# Patient Record
Sex: Female | Born: 1980 | Hispanic: Yes | Marital: Single | Smoking: Never smoker
Health system: Southern US, Community
[De-identification: ages and names within clinical notes are randomized; demographics above are authoritative.]

---

## 2015-11-20 ENCOUNTER — Emergency Department (HOSPITAL_COMMUNITY)
Admission: EM | Admit: 2015-11-20 | Discharge: 2015-11-20 | Disposition: A | Discharge status: Home / Self Care | Payer: Self-pay | Attending: Emergency Medicine | Admitting: Emergency Medicine

## 2015-11-20 ENCOUNTER — Emergency Department (HOSPITAL_COMMUNITY): Payer: Self-pay

## 2015-11-20 ENCOUNTER — Encounter (HOSPITAL_COMMUNITY): Payer: Self-pay | Admitting: Emergency Medicine

## 2015-11-20 DIAGNOSIS — R112 Nausea with vomiting, unspecified: Secondary | ICD-10-CM

## 2015-11-20 DIAGNOSIS — K529 Noninfective gastroenteritis and colitis, unspecified: Secondary | ICD-10-CM | POA: Insufficient documentation

## 2015-11-20 DIAGNOSIS — R1011 Right upper quadrant pain: Secondary | ICD-10-CM

## 2015-11-20 DIAGNOSIS — Z3202 Encounter for pregnancy test, result negative: Secondary | ICD-10-CM | POA: Insufficient documentation

## 2015-11-20 LAB — URINE MICROSCOPIC-ADD ON
BACTERIA UA: NONE SEEN
RBC / HPF: NONE SEEN RBC/hpf (ref 0–5)

## 2015-11-20 LAB — COMPREHENSIVE METABOLIC PANEL
ALT: 20 U/L (ref 14–54)
AST: 21 U/L (ref 15–41)
Albumin: 4.3 g/dL (ref 3.5–5.0)
Alkaline Phosphatase: 38 U/L (ref 38–126)
Anion gap: 9 (ref 5–15)
BUN: 10 mg/dL (ref 6–20)
CO2: 22 mmol/L (ref 22–32)
Calcium: 9 mg/dL (ref 8.9–10.3)
Chloride: 105 mmol/L (ref 101–111)
Creatinine, Ser: 0.66 mg/dL (ref 0.44–1.00)
GFR calc Af Amer: >60 mL/min (ref >60)
GFR calc non Af Amer: >60 mL/min (ref >60)
Glucose, Bld: 97 mg/dL (ref 65–99)
Potassium: 3.6 mmol/L (ref 3.5–5.1)
Sodium: 136 mmol/L (ref 135–145)
Total Bilirubin: 0.7 mg/dL (ref 0.3–1.2)
Total Protein: 7.5 g/dL (ref 6.5–8.1)

## 2015-11-20 LAB — CBC WITH DIFFERENTIAL/PLATELET
Basophils Absolute: 0 10*3/uL (ref 0.0–0.1)
Basophils Relative: 0 %
Eosinophils Absolute: 0.7 10*3/uL (ref 0.0–0.7)
Eosinophils Relative: 6 %
HCT: 42.1 % (ref 36.0–46.0)
Hemoglobin: 13.9 g/dL (ref 12.0–15.0)
Lymphocytes Relative: 23 %
Lymphs Abs: 2.7 10*3/uL (ref 0.7–4.0)
MCH: 29.2 pg (ref 26.0–34.0)
MCHC: 33 g/dL (ref 30.0–36.0)
MCV: 88.4 fL (ref 78.0–100.0)
Monocytes Absolute: 0.4 10*3/uL (ref 0.1–1.0)
Monocytes Relative: 4 %
Neutro Abs: 7.9 10*3/uL — ABNORMAL HIGH (ref 1.7–7.7)
Neutrophils Relative %: 67 %
Platelets: 203 10*3/uL (ref 150–400)
RBC: 4.76 MIL/uL (ref 3.87–5.11)
RDW: 13.1 % (ref 11.5–15.5)
WBC: 11.8 10*3/uL — ABNORMAL HIGH (ref 4.0–10.5)

## 2015-11-20 LAB — URINALYSIS, ROUTINE W REFLEX MICROSCOPIC
Bilirubin Urine: NEGATIVE
Glucose, UA: NEGATIVE mg/dL
Hgb urine dipstick: NEGATIVE
Ketones, ur: NEGATIVE mg/dL
Leukocytes, UA: NEGATIVE
Nitrite: NEGATIVE
Protein, ur: NEGATIVE mg/dL
Specific Gravity, Urine: 1.03 (ref 1.005–1.030)
pH: 6 (ref 5.0–8.0)

## 2015-11-20 LAB — POC URINE PREG, ED: Preg Test, Ur: NEGATIVE

## 2015-11-20 LAB — LIPASE, BLOOD: LIPASE: 35 U/L (ref 11–51)

## 2015-11-20 MED ORDER — MORPHINE SULFATE (PF) 4 MG/ML IV SOLN
4.0000 mg | Freq: Once | INTRAVENOUS | Status: AC
  Administered 2015-11-20: 4 mg via INTRAVENOUS
  Filled 2015-11-20: qty 1

## 2015-11-20 MED ORDER — IOHEXOL 300 MG/ML  SOLN
100.0000 mL | Freq: Once | INTRAMUSCULAR | Status: AC | PRN
  Administered 2015-11-20: 100 mL via INTRAVENOUS

## 2015-11-20 MED ORDER — IOHEXOL 300 MG/ML  SOLN
25.0000 mL | INTRAMUSCULAR | Status: DC
  Administered 2015-11-20: 50 mL via ORAL

## 2015-11-20 MED ORDER — IOHEXOL 300 MG/ML  SOLN: 50.0000 mL | INTRAMUSCULAR | Status: DC

## 2015-11-20 MED ORDER — SODIUM CHLORIDE 0.9 % IV BOLUS (SEPSIS)
1000.0000 mL | Freq: Once | INTRAVENOUS | Status: AC
  Administered 2015-11-20: 1000 mL via INTRAVENOUS

## 2015-11-20 MED ORDER — HYDROCODONE-ACETAMINOPHEN 5-325 MG PO TABS: 1.0000 | ORAL_TABLET | Freq: Four times a day (QID) | ORAL | Status: AC | PRN

## 2015-11-20 MED ORDER — ONDANSETRON HCL 4 MG PO TABS: 4.0000 mg | ORAL_TABLET | Freq: Three times a day (TID) | ORAL | Status: DC | PRN

## 2015-11-20 MED ORDER — ONDANSETRON HCL 4 MG/2ML IJ SOLN
4.0000 mg | Freq: Once | INTRAMUSCULAR | Status: AC
  Administered 2015-11-20: 4 mg via INTRAVENOUS
  Filled 2015-11-20: qty 2

## 2015-11-20 MED ORDER — CIPROFLOXACIN HCL 500 MG PO TABS: 500.0000 mg | ORAL_TABLET | Freq: Two times a day (BID) | ORAL | Status: AC

## 2015-11-20 MED ORDER — METRONIDAZOLE 500 MG PO TABS: 500.0000 mg | ORAL_TABLET | Freq: Three times a day (TID) | ORAL | Status: AC

## 2015-11-20 NOTE — Discharge Instructions (Signed)
Your abdominal pain is from an infection in your intestines. You will need to take the antibiotics Flagyl and Ciprofloxacin as directed and until finished. Avoid alcohol use while taking the antibiotics. Use zofran as needed for nausea. Use norco as needed for pain but don't drive or operate machinery while taking this medication. Go to [facility] to fill your prescriptions in order to get the lowest cost and use the coupons given to you in this packet. Follow up with [facility] in one week for recheck of your abdominal pain. Return to the ER for changes or worsening symptoms.  Abdominal (belly) pain can be caused by many things. Your caregiver performed an examination and possibly ordered blood/urine tests and imaging (CT scan, x-rays, ultrasound). Many cases can be observed and treated at home after initial evaluation in the emergency department. Even though you are being discharged home, abdominal pain can be unpredictable. Therefore, you need a repeated exam if your pain does not resolve, returns, or worsens. Most patients with abdominal pain don't have to be admitted to the hospital or have surgery, but serious problems like appendicitis and gallbladder attacks can start out as nonspecific pain. Many abdominal conditions cannot be diagnosed in one visit, so follow-up evaluations are very important. SEEK IMMEDIATE MEDICAL ATTENTION IF YOU DEVELOP ANY OF THE FOLLOWING SYMPTOMS:  The pain does not go away or becomes severe.   A temperature above 101 develops.   Repeated vomiting occurs (multiple episodes).   The pain becomes localized to portions of the abdomen. The right side could possibly be appendicitis. In an adult, the left lower portion of the abdomen could be colitis or diverticulitis.   Blood is being passed in stools or vomit (bright red or black tarry stools).   Return also if you develop chest pain, difficulty breathing, dizziness or fainting, or become confused, poorly  responsive, or inconsolable (young children).  The constipation stays for more than 4 days.   There is belly (abdominal) or rectal pain.   You do not seem to be getting better.      Dolor abdominal en adultos (Abdominal Pain, Adult) El dolor de estmago (abdominal) puede tener muchas causas. La mayora de las veces, el dolor de [place] no es peligroso. Muchos de [employer] de dolor de estmago pueden controlarse y tratarse en casa. CUIDADOS EN EL HOGAR   No tome medicamentos que lo ayuden a defecar (laxantes), salvo que su mdico se lo indique.  Solo tome los medicamentos que le haya indicado su mdico.  Coma o beba lo que le indique su mdico. Su mdico le dir si debe seguir una dieta especial. SOLICITE AYUDA SI:  No sabe cul es la causa del dolor de [place].  Tiene dolor de estmago cuando siente ganas de vomitar (nuseas) o tiene colitis (diarrea).  Tiene dolor durante la miccin o la evacuacin.  El dolor de estmago lo despierta de noche.  Tiene dolor de [employer] empeora o [place] cuando come.  Tiene dolor de [employer] empeora cuando come [employer].  Tiene fiebre. SOLICITE AYUDA DE INMEDIATO SI:   El dolor no desaparece en un plazo mximo de 2horas.  No deja de (vomitar).  El dolor cambia y se Librarian, academic solo en la parte derecha o izquierda del [place].  La materia fecal es sanguinolenta o de aspecto alquitranado. ASEGRESE DE QUE:   Comprende estas instrucciones.  Controlar su afeccin.  Recibir ayuda de inmediato si no mejora o si empeora.   Esta  informacin no tiene Theme park managercomo fin reemplazar el consejo del mdico. Asegrese de hacerle al mdico cualquier pregunta que tenga.   Document Released: 03/06/2009 Document Revised: 12/29/2014 Elsevier Interactive Patient Education 2016 [employer]  Nuseas y vmitos (Nausea and Vomiting)  Naseas significa que tiene ganas de vomitar. Elvmitoes un reflejo por el que los contenidos del  estmago salen por la boca.  CUIDADOS EN EL HOGAR  Tome los medicamentos como le indic el mdico.  No se esfuerce por comer. Sin embargo, es necesario que tome lquidos.  Si tiene ganas de comer, [place] una dieta normal, segn las indicaciones del mdico.  Coma arroz, trigo, patatas, pan, carnes magras, yogur, frutas y vegetales.  Evite los alimentos UAL Corporationmuy grasos.  Beba gran cantidad de lquidos para mantener la orina de tono claro o color amarillo plido.  Consulte a su mdico como reponer la prdida de lquidos (rehidratacin). Los signos de prdida de lquidos (deshidratacin) son:  Chief of Staffentir mucha sed.  Tiene los labios y la boca secos.  Est mareado.  La orina es Lillyoscura.  Orina menos que lo normal.  Se siente confundido.  La respiracin o la frecuencia cardaca son rpidas. SOLICITE AYUDA DE INMEDIATO SI:  Observa sangre en su vmito.  La materia fecal (heces)es negra o de color rojo.  Siente un dolor de cabeza muy intenso o el cuello rgido.  Se siente confundido.  Siente dolor muy fuerte en el vientre (abdominal).  Tiene dolor en el pecho o dificultad para respirar.  No ha orinado durante 8 horas.  Tiene la piel fra y pegajosa.  Sigue vomitando despus de 24  48 horas.  Tiene fiebre. ASEGRESE QUE:  Comprende estas instrucciones.  Controlar la enfermedad.  Puede solicitar ayuda de inmediato si los sntomas no mejoran, o si empeoran.   Esta informacin no tiene Theme park managercomo fin reemplazar el consejo del mdico. Asegrese de hacerle al mdico cualquier pregunta que tenga.   Document Released: 05/28/2010 Document Revised: 03/01/2012 Elsevier Interactive Patient Education [employer]2016 Elsevier Inc.

## 2015-11-20 NOTE — ED Provider Notes (Signed)
CSN: [id number]     Arrival date & time 11/20/15  1022 History   First MD Initiated Contact with Patient 11/20/15 1115     Chief Complaint  Patient presents with  . Abdominal Pain     (Consider location/radiation/quality/duration/timing/severity/associated sxs/prior Treatment) HPI Comments: Jean Vincent is a 34 y.o. female with no PMHx or PSHx, who presents to the ED with complaints of gradual onset right upper quadrant abdominal pain 4 days. She describes the pain is 10/10 intermittent crampy type pain in the right upper quadrant radiating to her back, worse with eating, and unrelieved with ibuprofen. Associated symptoms include nausea and 3 episodes of nonbloody nonbilious emesis which started today. She recently traveled from [place] on 11/16.  She denies any fevers, chills, chest pain, shortness of breath, diarrhea, constipation, melena, hematochezia, obstipation, dysuria, hematuria, vaginal bleeding or discharge, numbness, tingling, or weakness. She denies any sick contacts, alcohol use, chronic NSAID use, recent antibiotics, prior abdominal surgeries, or suspicious food intake. LMP was 11/16. She denies sexually active at this time or in the recent past. She has no medical problems and takes no medications regularly. 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  Patient is a 34 y.o. female presenting with abdominal pain. The history is provided by the patient. A language interpreter was used (family and provider).  Abdominal Pain Pain location:  RUQ Pain quality: cramping   Pain radiates to:  Back Pain severity:  Severe Onset quality:  Gradual Duration:  4 days Timing:  Intermittent Progression:  Unchanged Chronicity:  New Context: recent travel   Context: not sick contacts and not suspicious food intake   Relieved by:  Nothing Worsened by:  Eating Ineffective treatments:  NSAIDs Associated symptoms: nausea and vomiting   Associated symptoms: no chest pain, no chills, no constipation, no  diarrhea, no dysuria, no fever, no flatus, no hematemesis, no hematochezia, no hematuria, no melena, no shortness of breath, no vaginal bleeding and no vaginal discharge   Risk factors: no alcohol abuse, has not had multiple surgeries and no NSAID use     History reviewed. No pertinent past medical history. History reviewed. No pertinent past surgical history. No family history on file. Social History  Substance Use Topics  . Smoking status: Never Smoker   . Smokeless tobacco: None  . Alcohol Use: No   OB History    No data available     Review of Systems  Constitutional: Negative for fever and chills.  Respiratory: Negative for shortness of breath.   Cardiovascular: Negative for chest pain.  Gastrointestinal: Positive for nausea, vomiting and abdominal pain. Negative for diarrhea, constipation, blood in stool, melena, hematochezia, flatus and hematemesis.  Genitourinary: Negative for dysuria, hematuria, vaginal bleeding and vaginal discharge.  Musculoskeletal: Negative for myalgias and arthralgias.  Skin: Negative for color change.  Allergic/Immunologic: Negative for immunocompromised state.  Neurological: Negative for weakness and numbness.  Psychiatric/Behavioral: Negative for confusion.   10 Systems reviewed and are negative for acute change except as noted in the HPI.    Allergies  Review of patient's allergies indicates not on file.  Home Medications   Prior to Admission medications   Not on File   BP 108/77 mmHg  Pulse 82  Temp(Src) 97.6 F (36.4 C) (Oral)  Resp 20  SpO2 100%  LMP 11/07/2015 Physical Exam  Constitutional: She is oriented to person, place, and time. Vital signs are normal. She appears well-developed and well-nourished.  Non-toxic appearance. No distress.  Afebrile, nontoxic, NAD  HENT:  Head:  Normocephalic and atraumatic.  Mouth/Throat: Oropharynx is clear and moist and mucous membranes are normal.  Eyes: Conjunctivae and EOM are normal.  Right eye exhibits no discharge. Left eye exhibits no discharge.  Neck: Normal range of motion. Neck supple.  Cardiovascular: Normal rate, regular rhythm, normal heart sounds and intact distal pulses.  Exam reveals no gallop and no friction rub.   No murmur heard. Pulmonary/Chest: Effort normal and breath sounds normal. No respiratory distress. She has no decreased breath sounds. She has no wheezes. She has no rhonchi. She has no rales.  Abdominal: Soft. Normal appearance and bowel sounds are normal. She exhibits no distension. There is tenderness in the right upper quadrant and right lower quadrant. There is CVA tenderness (mild R sided), tenderness at McBurney's point (nearby, not quite over McBurney's) and positive Murphy's sign. There is no rigidity, no rebound and no guarding.    Soft, nondistended, +BS throughout, with tenderness most notably in the RUQ but somewhat down into the RLQ nearing Mcburney's point but not quite to it, not extending into the pelvic brim region, no r/g/r, +murphy's, and with mild R sided CVA TTP   Musculoskeletal: Normal range of motion.  Neurological: She is alert and oriented to person, place, and time. She has normal strength. No sensory deficit.  Skin: Skin is warm, dry and intact. No rash noted.  Psychiatric: She has a normal mood and affect.  Nursing note and vitals reviewed.   ED Course  Procedures (including critical care time) Labs Review Labs Reviewed  URINALYSIS, ROUTINE W REFLEX MICROSCOPIC (NOT AT [facility]) - Abnormal; Notable for the following:    Color, Urine AMBER (*)    APPearance TURBID (*)    All other components within normal limits  CBC WITH DIFFERENTIAL/PLATELET - Abnormal; Notable for the following:    WBC 11.8 (*)    Neutro Abs 7.9 (*)    All other components within normal limits  URINE MICROSCOPIC-ADD ON - Abnormal; Notable for the following:    Squamous Epithelial / LPF 0-5 (*)    All other components within normal limits  LIPASE,  BLOOD  COMPREHENSIVE METABOLIC PANEL  POC URINE PREG, ED    Imaging Review Ct Abdomen Pelvis W Contrast  11/20/2015  CLINICAL DATA:  Primarily right-sided abdominal pain for several days EXAM: CT ABDOMEN AND PELVIS WITH CONTRAST TECHNIQUE: Multidetector CT imaging of the abdomen and pelvis was performed using the standard protocol following bolus administration of intravenous contrast. Oral contrast was also administered. CONTRAST:  OMNIPAQUE IOHEXOL 300 MG/ML  SOLN COMPARISON:  None. FINDINGS: Lower chest: There is slight bibasilar atelectasis posteriorly. There is a tiny granuloma in the posterior segment right lower lobe. Hepatobiliary: There is a 4 mm probable cyst in the medial segment of the left lobe of the liver near the fissure for the ligamentum teres. A second probable tiny cyst is noted in the anterior dome of the liver on the right. No other focal liver lesions are identified. The gallbladder wall is not appreciably thickened. There is no biliary duct dilatation. Pancreas: No pancreatic mass or inflammatory focus. Spleen: No splenic lesions are identified. Adrenals/Urinary Tract: Adrenals appear normal bilaterally. Kidneys bilaterally show no mass or hydronephrosis on either side. There is no perinephric stranding or thickening on either side. There is no renal or ureteral calculus on either side. Urinary bladder is midline with wall thickness within normal limits. Stomach/Bowel: There is a loop of small bowel in the right abdomen in the mid ileal region  with wall thickening. The surrounding mesenteric a appears unremarkable. Similar bowel wall thickening is not noted elsewhere. There is no bowel obstruction. No free air or portal venous air. Vascular/Lymphatic: There is no abdominal aortic aneurysm. Major mesenteric vessels appear patent. No vascular lesions are appreciable. No adenopathy is appreciable in the abdomen or pelvis. Reproductive: Uterus is anteverted. There is an intrauterine  device within the endometrium. There is a dominant follicle in the left ovary measuring 2.0 x 2.0 cm. There is a small amount of fluid tracking from the left ovary into the cul-de-sac region, consistent with recent ovarian cyst leakage/ rupture. Other: The appendix appears normal. There is no abscess in the abdomen or pelvis. Musculoskeletal: There are no blastic or lytic bone lesions. There is a relatively shallow acetabulum on the left compared to the right with mild remodeling of the left femoral head and neck. Question residua of old slipped capital femoral epiphysis on the left. There is no intramuscular lesion or abdominal wall lesion. IMPRESSION: Evidence of wall thickening in a portion of the mid ileum. Terminal ileum appears normal. Suspect localized enteritis in the mid ileal region. No bowel obstruction. No bowel wall pneumatosis. Appendix appears normal. No bowel obstruction. No abscess. No gallbladder pathology demonstrable by CT. Suspect recent ovarian cyst leakage/rupture. A small amount of fluid tracks from left ovary into the cul-de-sac region. Intrauterine device within the endometrium. Remodeling in the proximal left femur suggests residua of prior slipped capital femoral epiphysis on the left. Right hip joint region appears normal. No renal or ureteral calculus. No hydronephrosis. No perinephric stranding or thickening. Symmetric enhancement of renal parenchyma bilaterally. Electronically Signed   By: Bretta Bang III M.D.   On: 11/20/2015 15:02   I have personally reviewed and evaluated these images and lab results as part of my medical decision-making.   EKG Interpretation None      MDM   Final diagnoses:  RUQ abdominal pain  Non-intractable vomiting with nausea, vomiting of unspecified type  Enteritis    34 y.o. female here with 4 days of RUQ abd pain and 1 day of n/v. Pain mostly occurs with eating. On exam, mostly tender in RUQ with +murphy's sign but some mild RLQ  tenderness somewhat near McBurney's point, not really tender down into the pelvis, some mild R flank tenderness. No pelvic complaints, and given that most of her pain is in the RUQ, doubt need for pelvic exam at this time but could potentially warrant this if pain becomes more localized in the lower abdomen. Difficult to discern if this is gallbladder pathology vs appendicitis vs possibly kidney pathology or other abd/pelvic etiology. Given that I need to r/o appendicitis, and gallbladder pathology will also show up on CT, will proceed with CT imaging. If CT neg, could still potentially need to proceed with U/S to r/o gallbladder pathology. Will get labs, U/A, upreg, and give pain meds and nausea meds. Will give fluids. Will reassess shortly.   3:26 PM Upreg neg. U/A with few squamous, nitrite and leuk neg, no bacteria, doubt UTI/pyelo. Lipase WNL, CMP WNL. CBC with mildly elevated leukocytosis at 11.8. CT showing no gallbladder or renal pathology, shows mid-ileum thickening suspicious for enteritis, likely the cause of her symptoms. Pain and nausea improved, pt tolerating PO well here. Given recent travel and enteritis on CT, will start on cipro/flagyl. Will send home with pain meds and nausea meds. Will have her f/up with [facility] in 1wk to recheck symptoms. I explained the diagnosis and  have given explicit precautions to return to the ER including for any other new or worsening symptoms. The patient understands and accepts the medical plan as it's been dictated and I have answered their questions. Discharge instructions concerning home care and prescriptions have been given. The patient is STABLE and is discharged to home in good condition.   BP 102/56 mmHg  Pulse 85  Temp(Src) 98.1 F (36.7 C) (Oral)  Resp 14  SpO2 100%  LMP 11/07/2015  Meds ordered this encounter  Medications  . sodium chloride 0.9 % bolus 1,000 mL    Sig:   . morphine 4 MG/ML injection 4 mg    Sig:   . ondansetron (ZOFRAN)  injection 4 mg    Sig:   . iohexol (OMNIPAQUE) 300 MG/ML solution 100 mL    Sig:   . ondansetron (ZOFRAN) 4 MG tablet    Sig: Take 1 tablet (4 mg total) by mouth every 8 (eight) hours as needed for nausea or vomiting.    Dispense:  20 tablet    Refill:  0    Order Specific Question:  Supervising Provider    Answer:  MILLER, BRIAN [[id number]]  . ciprofloxacin (CIPRO) 500 MG tablet    Sig: Take 1 tablet (500 mg total) by mouth 2 (two) times daily. One po bid x 7 days    Dispense:  14 tablet    Refill:  0    Order Specific Question:  Supervising Provider    Answer:  Hyacinth MeekerMILLER, BRIAN [[id number]]  . metroNIDAZOLE (FLAGYL) 500 MG tablet    Sig: Take 1 tablet (500 mg total) by mouth 3 (three) times daily. One po tid x 7 days    Dispense:  21 tablet    Refill:  0    Order Specific Question:  Supervising Provider    Answer:  Hyacinth MeekerMILLER, BRIAN [[id number]]  . HYDROcodone-acetaminophen (NORCO) 5-325 MG tablet    Sig: Take 1 tablet by mouth every 6 (six) hours as needed for severe pain.    Dispense:  10 tablet    Refill:  0    Order Specific Question:  Supervising Provider    Answer:  Eber HongMILLER, BRIAN [[id number]]     Shericka Johnstone Camprubi-Soms, PA-C 11/20/15 1528  Tilden FossaElizabeth Rees, MD 11/21/15 [phone]

## 2015-11-20 NOTE — ED Notes (Signed)
Patient transported to CT 

## 2015-11-20 NOTE — ED Notes (Signed)
Per family states right lower abdominal pain for a few days-states she started vomiting last night

## 2015-11-22 ENCOUNTER — Encounter (HOSPITAL_COMMUNITY): Payer: Self-pay | Admitting: Emergency Medicine

## 2015-11-22 ENCOUNTER — Emergency Department (HOSPITAL_COMMUNITY)
Admission: EM | Admit: 2015-11-22 | Discharge: 2015-11-22 | Disposition: A | Discharge status: Home / Self Care | Payer: Self-pay | Attending: Emergency Medicine | Admitting: Emergency Medicine

## 2015-11-22 DIAGNOSIS — K529 Noninfective gastroenteritis and colitis, unspecified: Secondary | ICD-10-CM | POA: Insufficient documentation

## 2015-11-22 DIAGNOSIS — R112 Nausea with vomiting, unspecified: Secondary | ICD-10-CM

## 2015-11-22 DIAGNOSIS — R1013 Epigastric pain: Secondary | ICD-10-CM

## 2015-11-22 LAB — COMPREHENSIVE METABOLIC PANEL
ALT: 18 U/L (ref 14–54)
AST: 17 U/L (ref 15–41)
Albumin: 4.1 g/dL (ref 3.5–5.0)
Alkaline Phosphatase: 39 U/L (ref 38–126)
Anion gap: 7 (ref 5–15)
BUN: 8 mg/dL (ref 6–20)
CO2: 26 mmol/L (ref 22–32)
Calcium: 9.4 mg/dL (ref 8.9–10.3)
Chloride: 105 mmol/L (ref 101–111)
Creatinine, Ser: 0.65 mg/dL (ref 0.44–1.00)
GFR calc Af Amer: >60 mL/min (ref >60)
GFR calc non Af Amer: >60 mL/min (ref >60)
Glucose, Bld: 102 mg/dL — ABNORMAL HIGH (ref 65–99)
Potassium: 3.8 mmol/L (ref 3.5–5.1)
Sodium: 138 mmol/L (ref 135–145)
Total Bilirubin: 0.5 mg/dL (ref 0.3–1.2)
Total Protein: 7.1 g/dL (ref 6.5–8.1)

## 2015-11-22 LAB — I-STAT BETA HCG BLOOD, ED (MC, WL, AP ONLY)

## 2015-11-22 LAB — URINE MICROSCOPIC-ADD ON: RBC / HPF: NONE SEEN RBC/hpf (ref 0–5)

## 2015-11-22 LAB — URINALYSIS, ROUTINE W REFLEX MICROSCOPIC
BILIRUBIN URINE: NEGATIVE
Glucose, UA: NEGATIVE mg/dL
Hgb urine dipstick: NEGATIVE
KETONES UR: NEGATIVE mg/dL
NITRITE: NEGATIVE
PROTEIN: NEGATIVE mg/dL
Specific Gravity, Urine: 1.007 (ref 1.005–1.030)
pH: 7.5 (ref 5.0–8.0)

## 2015-11-22 LAB — CBC
HCT: 38 % (ref 36.0–46.0)
Hemoglobin: 12.6 g/dL (ref 12.0–15.0)
MCH: 28.9 pg (ref 26.0–34.0)
MCHC: 33.2 g/dL (ref 30.0–36.0)
MCV: 87.2 fL (ref 78.0–100.0)
Platelets: 219 10*3/uL (ref 150–400)
RBC: 4.36 MIL/uL (ref 3.87–5.11)
RDW: 13 % (ref 11.5–15.5)
WBC: 13.2 10*3/uL — ABNORMAL HIGH (ref 4.0–10.5)

## 2015-11-22 LAB — LIPASE, BLOOD: Lipase: 29 U/L (ref 11–51)

## 2015-11-22 MED ORDER — FENTANYL CITRATE (PF) 100 MCG/2ML IJ SOLN
25.0000 ug | Freq: Once | INTRAMUSCULAR | Status: AC
  Administered 2015-11-22: 25 ug via INTRAVENOUS
  Filled 2015-11-22: qty 2

## 2015-11-22 MED ORDER — ONDANSETRON HCL 4 MG/2ML IJ SOLN
4.0000 mg | Freq: Once | INTRAMUSCULAR | Status: AC
  Administered 2015-11-22: 4 mg via INTRAVENOUS
  Filled 2015-11-22: qty 2

## 2015-11-22 MED ORDER — METRONIDAZOLE 500 MG PO TABS
500.0000 mg | ORAL_TABLET | Freq: Once | ORAL | Status: AC
  Administered 2015-11-22: 500 mg via ORAL
  Filled 2015-11-22: qty 1

## 2015-11-22 MED ORDER — CIPROFLOXACIN HCL 500 MG PO TABS
500.0000 mg | ORAL_TABLET | Freq: Once | ORAL | Status: AC
  Administered 2015-11-22: 500 mg via ORAL
  Filled 2015-11-22: qty 1

## 2015-11-22 MED ORDER — SODIUM CHLORIDE 0.9 % IV BOLUS (SEPSIS)
1000.0000 mL | Freq: Once | INTRAVENOUS | Status: AC
  Administered 2015-11-22: 1000 mL via INTRAVENOUS

## 2015-11-22 MED ORDER — ONDANSETRON 4 MG PO TBDP: 4.0000 mg | ORAL_TABLET | Freq: Three times a day (TID) | ORAL | Status: AC | PRN

## 2015-11-22 NOTE — ED Provider Notes (Signed)
CSN: [id number]     Arrival date & time 11/22/15  1808 History   First MD Initiated Contact with Patient 11/22/15 2000     Chief Complaint  Patient presents with  . Abdominal Pain  . Emesis    HPI  Jean Vincent is an [age] y.o. female with no significant PMH who presents to the ED for evaluation of abdominal pain and N/V. She is accompanied by her brother-in-law who acts as Nurse, learning disabilitytranslator as pt is spanish speaking. Pt was seen in the ED two days ago for same complaints after recent travel to [place], found to have enteritis on CT abd/pelvis and started on cipro/flagyl. Pt reports she initially went home feeling better with IV zofran and morphine on board. States that she took the PO zofran and was able to tolerate PO but then since yesterday she has progressively felt worse. She has been unable to tolerate PO. States she has had at least 4 episodes of NBNB emesis today. States that she is unable to take her abx because she keeps throwing them back up. Denies any diarrhea. States that her abdominal pain feels more epigastric now. Denies chest pain or SOB.   History reviewed. No pertinent past medical history. History reviewed. No pertinent past surgical history. No family history on file. Social History  Substance Use Topics  . Smoking status: Never Smoker   . Smokeless tobacco: None  . Alcohol Use: No   OB History    No data available     Review of Systems  All other systems reviewed and are negative.     Allergies  Peanuts  Home Medications   Prior to Admission medications   Medication Sig Start Date End Date Taking? Authorizing Provider  ciprofloxacin (CIPRO) 500 MG tablet Take 1 tablet (500 mg total) by mouth 2 (two) times daily. One po bid x 7 days 11/20/15  Yes Mercedes Camprubi-Soms, PA-C  HYDROcodone-acetaminophen (NORCO) 5-325 MG tablet Take 1 tablet by mouth every 6 (six) hours as needed for severe pain. 11/20/15  Yes Mercedes Camprubi-Soms, PA-C  metroNIDAZOLE (FLAGYL)  500 MG tablet Take 1 tablet (500 mg total) by mouth 3 (three) times daily. One po tid x 7 days 11/20/15  Yes Mercedes Camprubi-Soms, PA-C  ondansetron (ZOFRAN) 4 MG tablet Take 1 tablet (4 mg total) by mouth every 8 (eight) hours as needed for nausea or vomiting. 11/20/15  Yes Mercedes Camprubi-Soms, PA-C  ibuprofen (ADVIL,MOTRIN) 200 MG tablet Take 200 mg by mouth every 4 (four) hours as needed for fever, headache, mild pain, moderate pain or cramping.    Historical Provider, MD   BP 111/71 mmHg  Pulse 75  Temp(Src) 98.5 F (36.9 C) (Oral)  Resp 17  SpO2 100%  LMP 11/07/2015 Physical Exam  Constitutional: She is oriented to person, place, and time. She appears ill.  HENT:  Right Ear: External ear normal.  Left Ear: External ear normal.  Nose: Nose normal.  Mouth/Throat: Oropharynx is clear and moist. No oropharyngeal exudate.  Eyes: Conjunctivae and EOM are normal. Pupils are equal, round, and reactive to light.  Neck: Normal range of motion. Neck supple.  Cardiovascular: Normal rate, regular rhythm, normal heart sounds and intact distal pulses.   Pulmonary/Chest: Effort normal and breath sounds normal. No respiratory distress. She has no wheezes.  Abdominal: Soft. Bowel sounds are normal. She exhibits no distension. There is tenderness in the right upper quadrant and epigastric area. There is no rigidity, no rebound, no guarding, no tenderness at McBurney's point  and negative Murphy's sign.  + R CVA tenderness  Musculoskeletal: She exhibits no edema.  Neurological: She is alert and oriented to person, place, and time. No cranial nerve deficit.  Skin: Skin is warm and dry.  Psychiatric: She has a normal mood and affect.  Nursing note and vitals reviewed.   ED Course  Procedures (including critical care time) Labs Review Labs Reviewed  COMPREHENSIVE METABOLIC PANEL - Abnormal; Notable for the following:    Glucose, Bld 102 (*)    All other components within normal limits  CBC -  Abnormal; Notable for the following:    WBC 13.2 (*)    All other components within normal limits  LIPASE, BLOOD  URINALYSIS, ROUTINE W REFLEX MICROSCOPIC (NOT AT [facility])  I-STAT BETA HCG BLOOD, ED ([facility], [facility], [facility] ONLY)    Imaging Review No results found. I have personally reviewed and evaluated these images and lab results as part of my medical decision-making.   EKG Interpretation None      MDM   Final diagnoses:  Enteritis  Epigastric pain  Non-intractable vomiting with nausea, vomiting of unspecified type    Pt vital signs unremarkable. White count slightly up from 2 days ago to 13.2. Will give fluids, pain med, and zofran here. Pt looks uncomfortable but not toxic.   Pt reports feeling much improved. Will PO trial with her cipro/flagyl. Pt was given prescription for regular zofran but would like to be changed to zforan odt.  However also discussed that if she is unable to tolerate PO might have to admit for intractable nausea/vomiting  Pt able to tolerate PO. Gave rx for zofran odt. Instructed pt to take around the clock for the next couple of days so she can tolerate abx therapy for her enteritis. Encouraged plenty of fluid intake and BRAT diet. Return precautions given.  Carlene Coria, PA-C 11/23/15 1448  Lorre Nick, MD 11/23/15 [phone]

## 2015-11-22 NOTE — Discharge Instructions (Signed)
Your labs are unremarkable today. I think your symptoms are likely from the enteritis that we saw on the CT scan a couple days ago. We were able to get your nausea and pain under control today in the ER. We gave you today's evening dose of the antibiotics you have been taking at home. Please continue and finish the antibiotics as prescribed. I will give you the dissolving kind of Zofran (anti-nausea medicine). Please follow-up with your primary care provider within one week. If you do not have one, please call one of the clinics below to establish a primary care provider.     Emergency Department Resource Guide 1) Find a Doctor and Pay Out of Pocket Although you won't have to find out who is covered by your insurance plan, it is a good idea to ask around and get recommendations. You will then need to call the office and see if the doctor you have chosen will accept you as a new patient and what types of options they offer for patients who are self-pay. Some doctors offer discounts or will set up payment plans for their patients who do not have insurance, but you will need to ask so you aren't surprised when you get to your appointment.  2) Contact Your Local Health Department Not all health departments have doctors that can see patients for sick visits, but many do, so it is worth a call to see if yours does. If you don't know where your local health department is, you can check in your phone book. The CDC also has a tool to help you locate your state's health department, and many state websites also have listings of all of their local health departments.  3) Find a Walk-in Clinic If your illness is not likely to be very severe or complicated, you may want to try a walk in clinic. These are popping up all over the country in pharmacies, drugstores, and shopping centers. They're usually staffed by nurse practitioners or physician assistants that have been trained to treat common illnesses and complaints.  They're usually fairly quick and inexpensive. However, if you have serious medical issues or chronic medical problems, these are probably not your best option.  No Primary Care Doctor: - Call Health Connect at  [phone] - they can help you locate a primary care doctor that  accepts your insurance, provides certain services, etc. - Physician Referral [phone]  Emergency Department Resource Guide 1) Find a Doctor and Pay Out of Pocket Although you won't have to find out who is covered by your insurance plan, it is a good idea to ask around and get recommendations. You will then need to call the office and see if the doctor you have chosen will accept you as a new patient and what types of options they offer for patients who are self-pay. Some doctors offer discounts or will set up payment plans for their patients who do not have insurance, but you will need to ask so you aren't surprised when you get to your appointment.  2) Contact Your Local Health Department Not all health departments have doctors that can see patients for sick visits, but many do, so it is worth a call to see if yours does. If you don't know where your local health department is, you can check in your phone book. The CDC also has a tool to help you locate your state's health department, and many state websites also have listings of all of their local health  departments.  3) Find a [facility] If your illness is not likely to be very severe or complicated, you may want to try a walk in clinic. These are popping up all over the country in pharmacies, drugstores, and shopping centers. They're usually staffed by nurse practitioners or physician assistants that have been trained to treat common illnesses and complaints. They're usually fairly quick and inexpensive. However, if you have serious medical issues or chronic medical problems, these are probably not your best option.  No Primary Care Doctor: - Call Health  Connect at  [phone] - they can help you locate a primary care doctor that  accepts your insurance, provides certain services, etc. - Physician Referral Service- [phone]  Chronic Pain Problems: Organization         Address  Phone   Notes  [facility]  [phone] Patients need to be referred by their primary care doctor.   Medication Assistance: Organization         Address  Phone   Notes  [facility] Medication [facility] [address] [place], [place] [address] [phone] --Must be a resident of [facility] -- Must have NO insurance coverage whatsoever (no Medicaid/ Medicare, etc.) -- The pt. MUST have a primary care doctor that directs their care regularly and follows them in the community   [facility]  [phone]   [employer]  [phone]    Agencies that provide inexpensive medical care: Organization         Address  Phone   Notes  [place]  [phone]   [facility]    [phone]   [facility] [place], [place] [address] [phone]   [place] [address] [place] [phone]   [facility]anned [facility]    [phone]   [facility]    [phone]   [place] and [place] [address], [place] Phone:  [phone], Fax:  [phone] Hours of Operation:  9 am - 6 pm, M-F.  Also accepts Medicaid/Medicare and self-pay.  [facility] for [place] [address] [place], [address], [place] Phone: [phone], Fax: [phone]. Hours of Operation:  8:30 am - 5:30 pm, M-F.  Also accepts Medicaid and self-pay.  [facility] [place] [address], [place] Phone: [phone]   [place], [place], [place] [phone], Ext. [phone] Mondays & Thursdays: 7-9 AM.  First 15 patients are seen on a first come, first serve basis.     [place] Providers:  Organization         Address  Phone   Notes  [facility] [address], [place] [phone] Also accepts self-pay patients.  [facility] [id number], [place]  [phone]   [place], [address], [place] [phone]   [facility] [address], [place] [phone]   Lucianne Lei [address], [place]   [phone] Only accepts [place] patients after they have their name applied to their card.   Self-Pay (no insurance) in [facility]:  Organization         Address  Phone   Notes  Sickle Cell Patients, [facility] [address], [place] [phone]) [phone]   [facility] [address], [place] [phone]) [phone]   [facility]  [address], [place] [phone]   [facility]/Dr. Osei-Bonsu  [address] [place] [address], [place] or [phone], [place] [phone] Phone number for both [place] and [place] locations is the same.  [facility] [address], [place] [phone]) [phone]   [facility] [place] [address] [place] [address], [place] or [address] [phone](336) [phone]) [phone]   [facility] [address], [place] [phone](336) [phone], phone; [phone](336) [phone], fax Sees patients 1st and 3rd Saturday of every month.  Must not qualify for public or private insurance (i.e. Medicaid, Medicare, [place] Health Choice, Veterans' Benefits)  Household income should be no more than 200% of the poverty level The clinic cannot treat you if you are pregnant or think you are pregnant  Sexually transmitted diseases are not treated at the clinic.

## 2015-11-22 NOTE — ED Notes (Addendum)
Pt was seen here 2 days ago and was diagnosed with gastritis and "something else" per family.  Pt has been taking meds but symptoms have returned, abd pain and vomiting. Pt denies diarrhea. Pt describes pain in RUQ that radiates to her back. Pt states that vomits only after eating or drinking or taking meds.

## 2017-01-18 IMAGING — CT CT ABD-PELV W/ CM
2 of 4 series · 15 of 46 positions shown, 17 images · IV contrast (OMNIPAQUE 300)
Comparison: None.

CLINICAL DATA: Primarily right-sided abdominal pain for several
days

EXAM:
CT ABDOMEN AND PELVIS WITH CONTRAST
TECHNIQUE: Multidetector CT imaging of the abdomen and pelvis was performed
using the standard protocol following bolus administration of
intravenous contrast. Oral contrast was also administered.
CONTRAST:  100mL OMNIPAQUE IOHEXOL 300 MG/ML  SOLN

[Series 2: abd/pel with · axial · 0.64mm/px · z∈[-426,-36]mm · 12 of 86 slices shown, 14 images]
[im 4/86  soft-tissue]
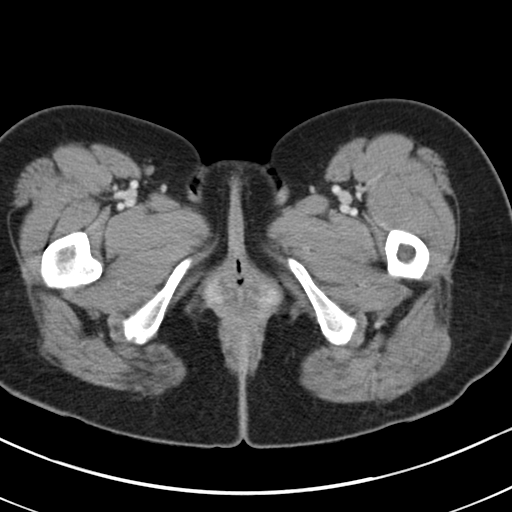
[im 4/86  bone]
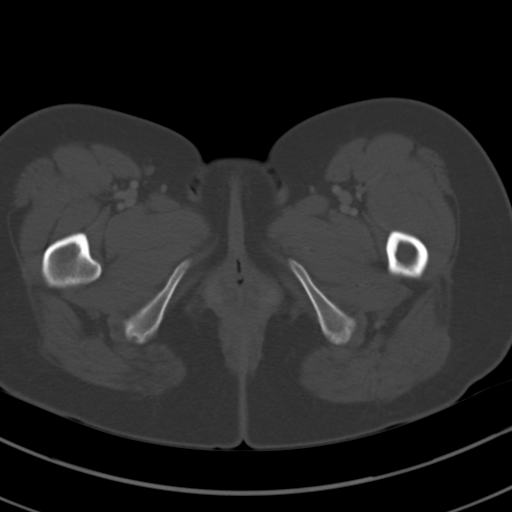
[im 11/86  soft-tissue]
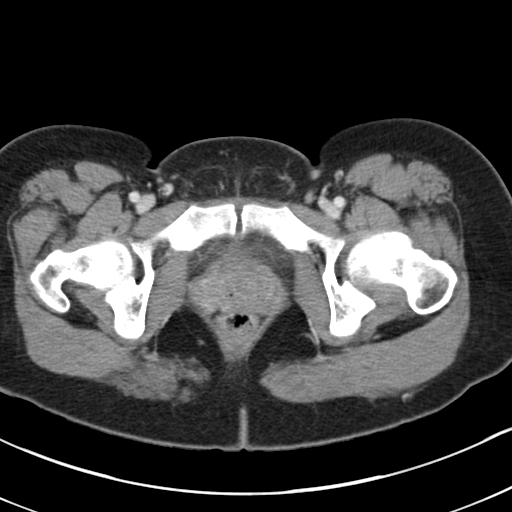
[im 18/86  soft-tissue]
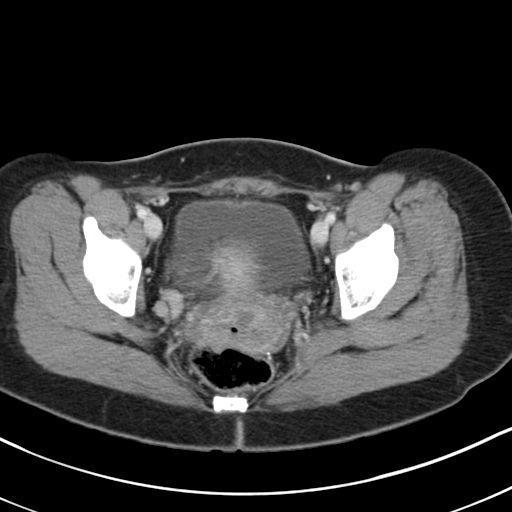
[im 25/86  soft-tissue]
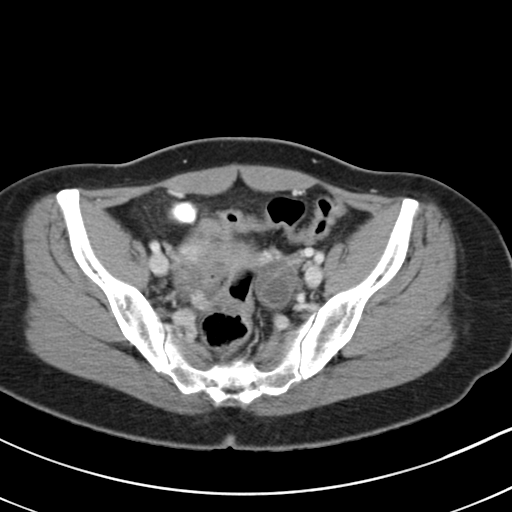
[im 32/86  soft-tissue]
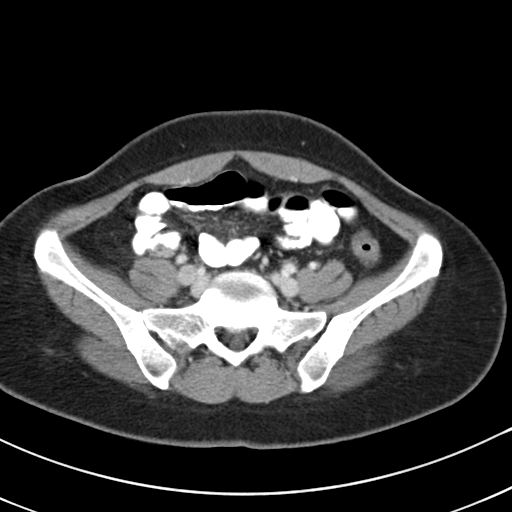
[im 39/86  soft-tissue]
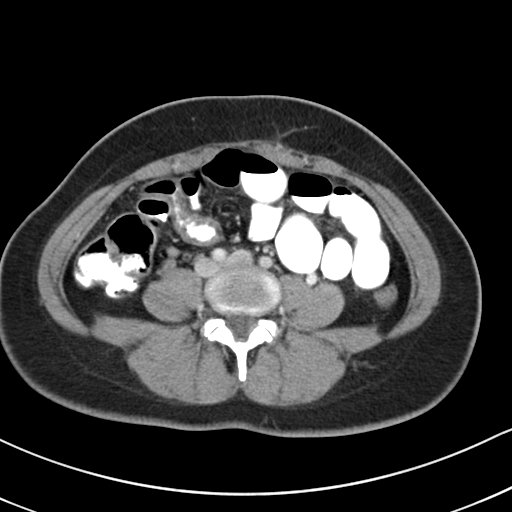
[im 47/86  soft-tissue]
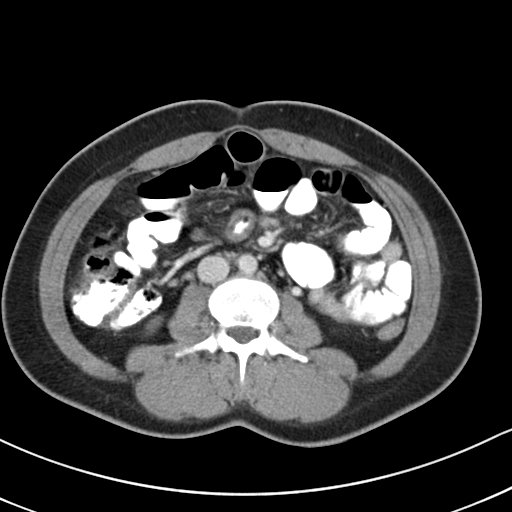
[im 54/86  soft-tissue]
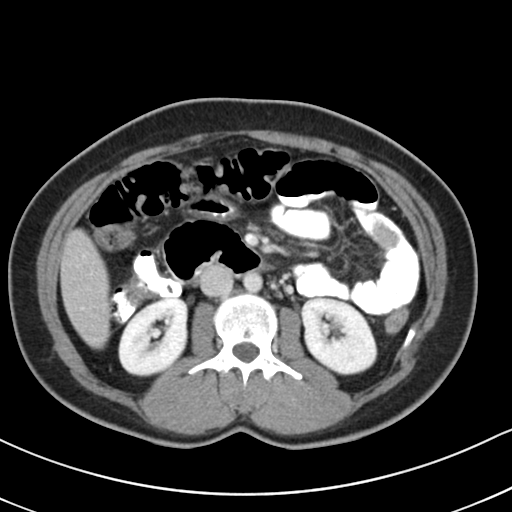
[im 61/86  soft-tissue]
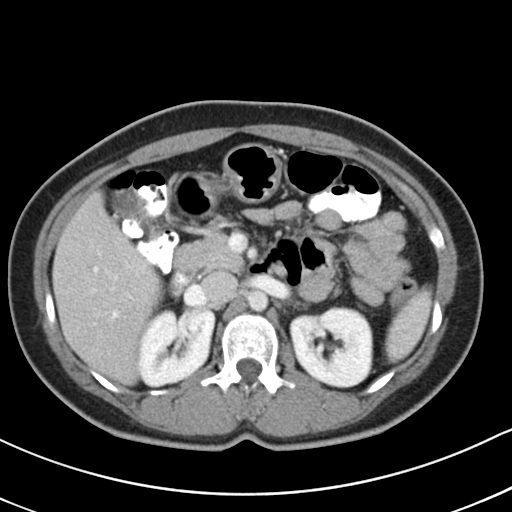
[im 61/86  bone]
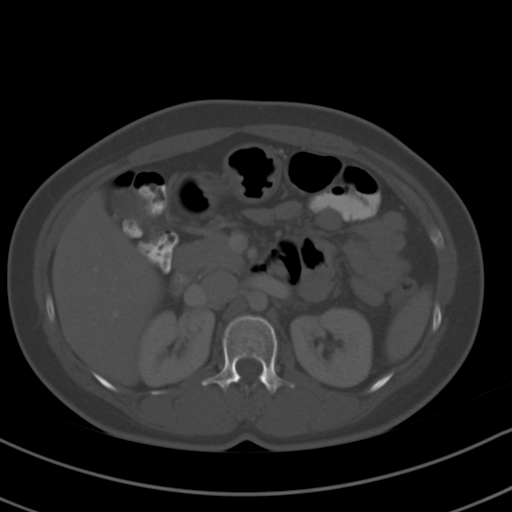
[im 68/86  soft-tissue]
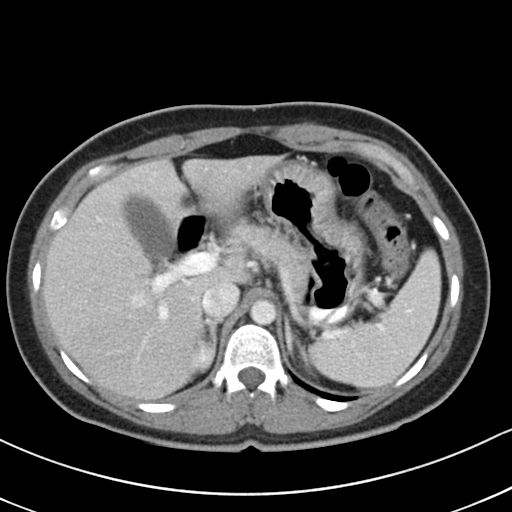
[im 75/86  soft-tissue]
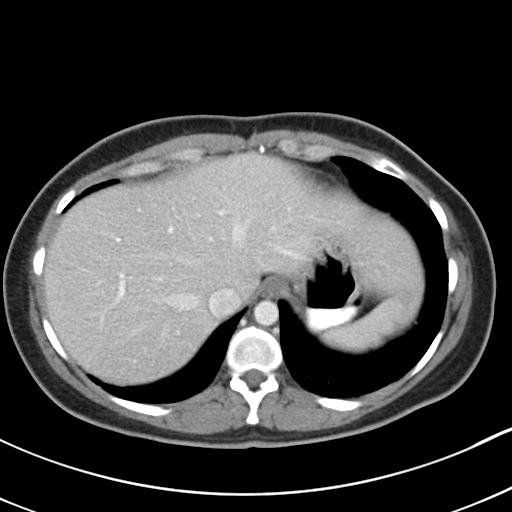
[im 82/86  soft-tissue]
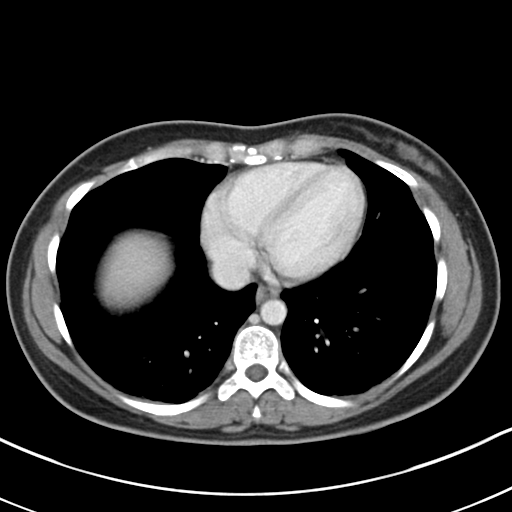

[Series 5: coronal a/|p · coronal · 0.76mm/px · 3 of 82 slices shown]
[im 28/82  soft-tissue]
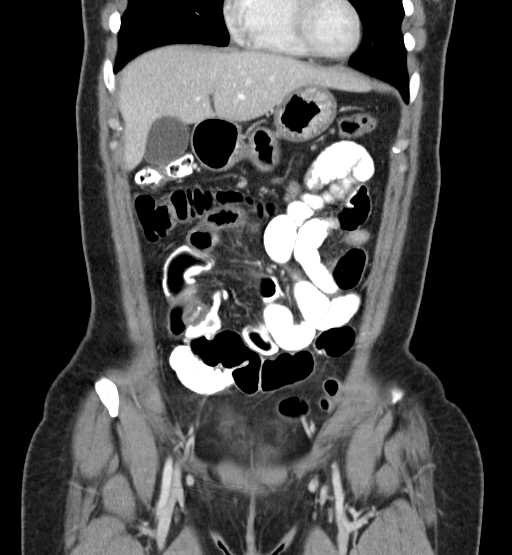
[im 37/82  soft-tissue]
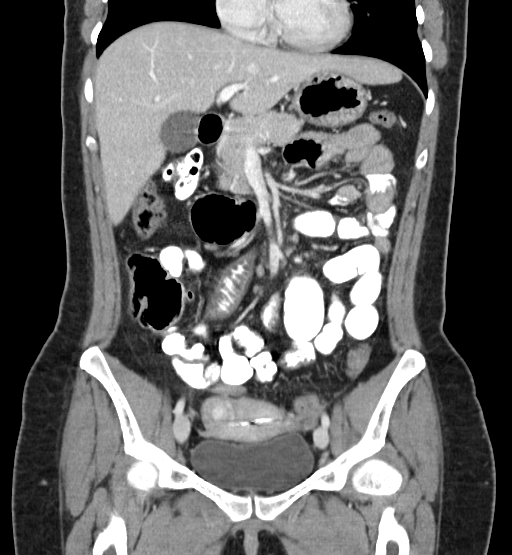
[im 46/82  soft-tissue]
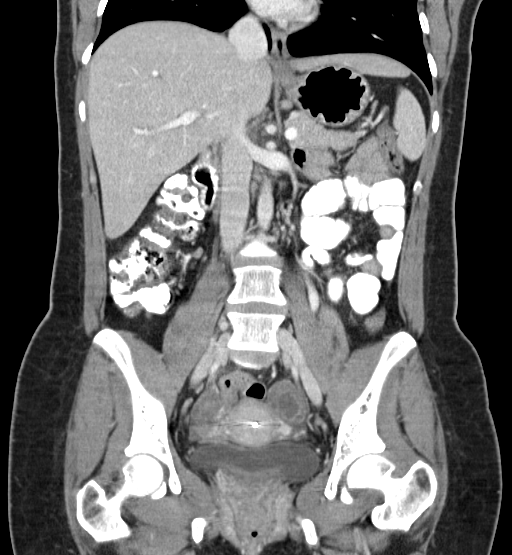

[15 of 46 positions shown; findings below may reference images not displayed]

FINDINGS: Lower chest: There is slight bibasilar atelectasis posteriorly.
There is a tiny granuloma in the posterior segment right lower lobe.

Hepatobiliary: There is a 4 mm probable cyst in the medial segment
of the left lobe of the liver near the fissure for the ligamentum
teres. A second probable tiny cyst is noted in the anterior dome of
the liver on the right. No other focal liver lesions are identified.
The gallbladder wall is not appreciably thickened. There is no
biliary duct dilatation.

Pancreas: No pancreatic mass or inflammatory focus.

Spleen: No splenic lesions are identified.

Adrenals/Urinary Tract: Adrenals appear normal bilaterally. Kidneys
bilaterally show no mass or hydronephrosis on either side. There is
no perinephric stranding or thickening on either side. There is no
renal or ureteral calculus on either side. Urinary bladder is
midline with wall thickness within normal limits.

Stomach/Bowel: There is a loop of small bowel in the right abdomen
in the mid ileal region with wall thickening. The surrounding
mesenteric a appears unremarkable. Similar bowel wall thickening is
not noted elsewhere. There is no bowel obstruction. No free air or
portal venous air.

Vascular/Lymphatic: There is no abdominal aortic aneurysm. Major
mesenteric vessels appear patent. No vascular lesions are
appreciable. No adenopathy is appreciable in the abdomen or pelvis.

Reproductive: Uterus is anteverted. There is an intrauterine device
within the endometrium. There is a dominant follicle in the left
ovary measuring 2.0 x 2.0 cm. There is a small amount of fluid
tracking from the left ovary into the cul-de-sac region, consistent
with recent ovarian cyst leakage/ rupture.

Other: The appendix appears normal. There is no abscess in the
abdomen or pelvis.

Musculoskeletal: There are no blastic or lytic bone lesions. There
is a relatively shallow acetabulum on the left compared to the right
with mild remodeling of the left femoral head and neck. Question
residua of old slipped capital femoral epiphysis on the left. There
is no intramuscular lesion or abdominal wall lesion.
IMPRESSION: Evidence of wall thickening in a portion of the mid ileum. Terminal
ileum appears normal. Suspect localized enteritis in the mid ileal
region. No bowel obstruction. No bowel wall pneumatosis.

Appendix appears normal. No bowel obstruction. No abscess. No
gallbladder pathology demonstrable by CT.

Suspect recent ovarian cyst leakage/rupture. A small amount of fluid
tracks from left ovary into the cul-de-sac region.

Intrauterine device within the endometrium.

Remodeling in the proximal left femur suggests residua of prior
slipped capital femoral epiphysis on the left. Right hip joint
region appears normal.

No renal or ureteral calculus. No hydronephrosis. No perinephric
stranding or thickening. Symmetric enhancement of renal parenchyma
bilaterally.
